# Patient Record
Sex: Male | Born: 1985 | Race: White | Hispanic: No | Marital: Single | Smoking: Current every day smoker
Health system: Southern US, Community
[De-identification: ages and names within clinical notes are randomized; demographics above are authoritative.]

## PROBLEM LIST (undated history)

## (undated) DIAGNOSIS — J45909 Unspecified asthma, uncomplicated: Secondary | ICD-10-CM

## (undated) DIAGNOSIS — J3089 Other allergic rhinitis: Secondary | ICD-10-CM

## (undated) DIAGNOSIS — J449 Chronic obstructive pulmonary disease, unspecified: Secondary | ICD-10-CM

## (undated) HISTORY — PX: ROTATOR CUFF REPAIR: SHX139

---

## 2021-06-06 ENCOUNTER — Encounter (HOSPITAL_BASED_OUTPATIENT_CLINIC_OR_DEPARTMENT_OTHER): Payer: Self-pay

## 2021-06-06 ENCOUNTER — Other Ambulatory Visit: Payer: Self-pay

## 2021-06-06 ENCOUNTER — Emergency Department (HOSPITAL_BASED_OUTPATIENT_CLINIC_OR_DEPARTMENT_OTHER): Payer: Self-pay

## 2021-06-06 ENCOUNTER — Emergency Department (HOSPITAL_BASED_OUTPATIENT_CLINIC_OR_DEPARTMENT_OTHER)
Admission: EM | Admit: 2021-06-06 | Discharge: 2021-06-06 | Disposition: A | Discharge status: Home / Self Care | Payer: Self-pay | Attending: Emergency Medicine | Admitting: Emergency Medicine

## 2021-06-06 DIAGNOSIS — W312XXA Contact with powered woodworking and forming machines, initial encounter: Secondary | ICD-10-CM | POA: Insufficient documentation

## 2021-06-06 DIAGNOSIS — Y93H2 Activity, gardening and landscaping: Secondary | ICD-10-CM | POA: Insufficient documentation

## 2021-06-06 DIAGNOSIS — F17210 Nicotine dependence, cigarettes, uncomplicated: Secondary | ICD-10-CM | POA: Insufficient documentation

## 2021-06-06 DIAGNOSIS — Z23 Encounter for immunization: Secondary | ICD-10-CM | POA: Insufficient documentation

## 2021-06-06 DIAGNOSIS — J449 Chronic obstructive pulmonary disease, unspecified: Secondary | ICD-10-CM | POA: Insufficient documentation

## 2021-06-06 DIAGNOSIS — S81012A Laceration without foreign body, left knee, initial encounter: Secondary | ICD-10-CM | POA: Insufficient documentation

## 2021-06-06 DIAGNOSIS — J45909 Unspecified asthma, uncomplicated: Secondary | ICD-10-CM | POA: Insufficient documentation

## 2021-06-06 HISTORY — DX: Unspecified asthma, uncomplicated: J45.909

## 2021-06-06 HISTORY — DX: Other allergic rhinitis: J30.89

## 2021-06-06 HISTORY — DX: Chronic obstructive pulmonary disease, unspecified: J44.9

## 2021-06-06 MED ORDER — TETANUS-DIPHTH-ACELL PERTUSSIS 5-2.5-18.5 LF-MCG/0.5 IM SUSY
0.5000 mL | PREFILLED_SYRINGE | Freq: Once | INTRAMUSCULAR | Status: AC
  Administered 2021-06-06: 0.5 mL via INTRAMUSCULAR
  Filled 2021-06-06: qty 0.5

## 2021-06-06 NOTE — ED Provider Notes (Signed)
[facility] EMERGENCY DEPARTMENT Provider Note   CSN: [id number] Arrival date & time: 06/06/21  1438     History Chief Complaint  Patient presents with   Extremity Laceration    Jose Sawyer is a 35 y.o. male with no prior past medical history that comes into the emergency department today for laceration.  Patient states that he was cutting his tree in his backyard that was dead, states that the tree " kicked back" at his left knee and he accidentally knicked his left knee with a chainsaw.  Patient states that he was wearing 2 pairs of pants and he has a small laceration to his left knee.  Denies any knee pain besides where he was cut.  Denies any popping to his knee.  Able to ambulate normally after this.  Denies any numbness or tingling or weakness into his knee.  Denies any difficulty ambulating.  Unknown when his last tetanus was.  Denies any injury elsewhere.  No difficulty bending knee.  No other complaints at this time.  HPI     Past Medical History:  Diagnosis Date   Asthma    COPD (chronic obstructive pulmonary disease) (HCC)    Environmental and seasonal allergies     There are no problems to display for this patient.   Past Surgical History:  Procedure Laterality Date   ROTATOR CUFF REPAIR         No family history on file.  Social History   Tobacco Use   Smoking status: Every Day    Pack years: 0.00    Types: Cigarettes  Substance Use Topics   Alcohol use: Yes    Comment: occ   Drug use: Never    Home Medications Prior to Admission medications   Not on File    Allergies    Aspirin, Penicillins, Risperidone and related, and Tegretol [carbamazepine]  Review of Systems   Review of Systems  Constitutional:  Negative for diaphoresis, fatigue and fever.  Eyes:  Negative for visual disturbance.  Respiratory:  Negative for shortness of breath.   Cardiovascular:  Negative for chest pain.  Gastrointestinal:  Negative for nausea and  vomiting.  Musculoskeletal:  Negative for arthralgias, back pain and myalgias.  Skin:  Positive for wound. Negative for color change, pallor and rash.  Neurological:  Negative for syncope, weakness, light-headedness, numbness and headaches.  Psychiatric/Behavioral:  Negative for behavioral problems and confusion.    Physical Exam Updated Vital Signs BP (!) 143/91 (BP Location: Left Arm)   Pulse 88   Temp 98.5 F (36.9 C) (Oral)   Resp 18   Ht 5\' 5"  (1.651 m)   Wt 67.1 kg   SpO2 100%   BMI 24.63 kg/m   Physical Exam Constitutional:      General: He is not in acute distress.    Appearance: Normal appearance. He is not ill-appearing, toxic-appearing or diaphoretic.  Cardiovascular:     Rate and Rhythm: Normal rate and regular rhythm.     Pulses: Normal pulses.  Pulmonary:     Effort: Pulmonary effort is normal.     Breath sounds: Normal breath sounds.  Musculoskeletal:        General: Normal range of motion.     Comments: 5 cm laceration to left knee.  Extremely superficial.  Bleeding has been controlled.  No other lacerations noted.  Normal range of motion to knee with normal laxity tests.  Good range of motion to ankle and hip as well.  DP pulse 2+.  Good sensation throughout.  Normal ambulation.  Skin:    General: Skin is warm and dry.     Capillary Refill: Capillary refill takes less than 2 seconds.  Neurological:     General: No focal deficit present.     Mental Status: He is alert and oriented to person, place, and time.  Psychiatric:        Mood and Affect: Mood normal.        Behavior: Behavior normal.        Thought Content: Thought content normal.    ED Results / Procedures / Treatments   Labs (all labs ordered are listed, but only abnormal results are displayed) Labs Reviewed - No data to display  EKG None  Radiology DG Knee 2 Views Left  Result Date: 06/06/2021 CLINICAL DATA:  Pain with laceration EXAM: LEFT KNEE - 1-2 VIEW COMPARISON:  None. FINDINGS:  Frontal and lateral views obtained. No fracture, dislocation, or joint effusion. Joint spaces appear normal. No erosive change. No radiopaque foreign body. No soft tissue air. IMPRESSION: No fracture, dislocation, or joint effusion. No appreciable arthropathy. No soft tissue air or radiopaque foreign body. Electronically Signed   By: Bretta Bang III M.D.   On: 06/06/2021 15:49    Procedures .Marland KitchenLaceration Repair  Date/Time: 06/06/2021 4:06 PM Performed by: Farrel Gordon, PA-C Authorized by: Farrel Gordon, PA-C   Consent:    Consent obtained:  Verbal   Consent given by:  Patient   Risks discussed:  Infection, need for additional repair, pain, poor cosmetic result and poor wound healing   Alternatives discussed:  No treatment and delayed treatment Universal protocol:    Procedure explained and questions answered to patient or proxy's satisfaction: yes     Relevant documents present and verified: yes     Test results available: yes     Imaging studies available: yes     Required blood products, implants, devices, and special equipment available: yes     Site/side marked: yes     Immediately prior to procedure, a time out was called: yes     Patient identity confirmed:  Verbally with patient Anesthesia:    Anesthesia method:  Local infiltration   Local anesthetic:  Lidocaine 1% w/o epi Laceration details:    Location:  Leg   Leg location:  L knee   Length (cm):  4   Depth (mm):  1 Exploration:    Hemostasis achieved with:  Direct pressure   Imaging obtained: x-ray     Imaging outcome: foreign body not noted     Wound extent: no areolar tissue violation noted and no fascia violation noted     Contaminated: no   Treatment:    Area cleansed with:  Povidone-iodine   Amount of cleaning:  Standard   Irrigation solution:  Sterile saline   Irrigation volume:  500   Irrigation method:  Pressure wash   Visualized foreign bodies/material removed: no     Debridement:  None    Undermining:  None   Scar revision: no   Skin repair:    Repair method:  Sutures   Suture size:  4-0   Suture material:  Prolene   Number of sutures:  5 Approximation:    Approximation:  Close Repair type:    Repair type:  Simple Post-procedure details:    Dressing:  Splint for protection   Procedure completion:  Tolerated well, no immediate complications   Medications Ordered in ED Medications  Tdap (BOOSTRIX) injection 0.5 mL (0.5 mLs Intramuscular Given 06/06/21 1520)    ED Course  I have reviewed the triage vital signs and the nursing notes.  Pertinent labs & imaging results that were available during my care of the patient were reviewed by me and considered in my medical decision making (see chart for details).    MDM Rules/Calculators/A&P                          Patient presents to the emergency department today for knee laceration. No concerns for joint injury.  Laceration is extremely superficial, patient was able to be sutured without any immediate complications.  Patient able to bend knee, distally neurovascularly intact.  Tetanus updated today.  Do not think that antibiotics are appropriate at this time since laceration is superficial and area was irrigated appropriately.Plain films negative.   Wound management discussed with patient, patient to be discharged at this time.  Patient is able to ambulate normally.  Doubt need for further emergent work up at this time. I explained the diagnosis and have given explicit precautions to return to the ER including for any other new or worsening symptoms. The patient understands and accepts the medical plan as it's been dictated and I have answered their questions. Discharge instructions concerning home care and prescriptions have been given. The patient is STABLE and is discharged to home in good condition.  Final Clinical Impression(s) / ED Diagnoses Final diagnoses:  Laceration of left knee, initial encounter    Rx / DC  Orders ED Discharge Orders     None        Farrel Gordon, PA-C 06/06/21 1614    Virgina Norfolk, DO 06/06/21 1623

## 2021-06-06 NOTE — ED Triage Notes (Addendum)
Pt states he cut left knee with chainsaw while cutting trees ~30 min PTA-lac noted-bleeding controlled-NAD-to triage in w/c

## 2021-06-06 NOTE — Discharge Instructions (Addendum)

## 2021-06-06 NOTE — ED Notes (Signed)
Patient transported to X-ray 

## 2021-06-06 NOTE — ED Notes (Signed)
Presents with laceration to left knee, approx 1-2cm in length, bleeding is under control. States was cutting down a tree when the tree "kicked back" at hit his left knee. Unknown on tetanus status.

## 2021-06-06 NOTE — ED Notes (Addendum)
Area cleaned and inspected. Has normal sensation, able to ambulate without difficulty, states when knee is still only c/o of soreness

## 2021-07-12 ENCOUNTER — Encounter (HOSPITAL_BASED_OUTPATIENT_CLINIC_OR_DEPARTMENT_OTHER): Payer: Self-pay | Admitting: *Deleted

## 2021-07-12 ENCOUNTER — Other Ambulatory Visit: Payer: Self-pay

## 2021-07-12 DIAGNOSIS — J40 Bronchitis, not specified as acute or chronic: Secondary | ICD-10-CM | POA: Insufficient documentation

## 2021-07-12 DIAGNOSIS — J449 Chronic obstructive pulmonary disease, unspecified: Secondary | ICD-10-CM | POA: Insufficient documentation

## 2021-07-12 DIAGNOSIS — J45909 Unspecified asthma, uncomplicated: Secondary | ICD-10-CM | POA: Diagnosis not present

## 2021-07-12 DIAGNOSIS — F17210 Nicotine dependence, cigarettes, uncomplicated: Secondary | ICD-10-CM | POA: Insufficient documentation

## 2021-07-12 DIAGNOSIS — Z20822 Contact with and (suspected) exposure to covid-19: Secondary | ICD-10-CM | POA: Diagnosis not present

## 2021-07-12 DIAGNOSIS — J069 Acute upper respiratory infection, unspecified: Secondary | ICD-10-CM | POA: Insufficient documentation

## 2021-07-12 DIAGNOSIS — R059 Cough, unspecified: Secondary | ICD-10-CM | POA: Diagnosis present

## 2021-07-12 NOTE — ED Triage Notes (Signed)
Headache and sob x 4 days.

## 2021-07-13 ENCOUNTER — Emergency Department (HOSPITAL_BASED_OUTPATIENT_CLINIC_OR_DEPARTMENT_OTHER): Payer: No Typology Code available for payment source

## 2021-07-13 ENCOUNTER — Emergency Department (HOSPITAL_BASED_OUTPATIENT_CLINIC_OR_DEPARTMENT_OTHER)
Admission: EM | Admit: 2021-07-13 | Discharge: 2021-07-13 | Disposition: A | Discharge status: Home / Self Care | Payer: No Typology Code available for payment source | Attending: Emergency Medicine | Admitting: Emergency Medicine

## 2021-07-13 DIAGNOSIS — J40 Bronchitis, not specified as acute or chronic: Secondary | ICD-10-CM

## 2021-07-13 DIAGNOSIS — J069 Acute upper respiratory infection, unspecified: Secondary | ICD-10-CM

## 2021-07-13 LAB — RESP PANEL BY RT-PCR (FLU A&B, COVID) ARPGX2
Influenza A by PCR: NEGATIVE
Influenza B by PCR: NEGATIVE
SARS Coronavirus 2 by RT PCR: NEGATIVE

## 2021-07-13 MED ORDER — DOXYCYCLINE HYCLATE 100 MG PO CAPS: 100.0000 mg | ORAL_CAPSULE | Freq: Two times a day (BID) | ORAL | 0 refills | Status: DC

## 2021-07-13 MED ORDER — ALBUTEROL SULFATE HFA 108 (90 BASE) MCG/ACT IN AERS: 1.0000 | INHALATION_SPRAY | Freq: Four times a day (QID) | RESPIRATORY_TRACT | 0 refills | Status: AC | PRN

## 2021-07-13 MED ORDER — ALBUTEROL SULFATE HFA 108 (90 BASE) MCG/ACT IN AERS
2.0000 | INHALATION_SPRAY | Freq: Once | RESPIRATORY_TRACT | Status: AC
  Administered 2021-07-13: 2 via RESPIRATORY_TRACT
  Filled 2021-07-13: qty 6.7

## 2021-07-13 NOTE — Discharge Instructions (Addendum)
Using albuterol every 6 hours as needed and take the antibiotics as prescribed.  Stop smoking.  Your COVID test is negative but COVID is still possible so keep yourself quarantined while you are feeling ill.  Return to the ED with difficulty breathing, chest pain, any other concerns.

## 2021-07-13 NOTE — ED Provider Notes (Signed)
[facility] EMERGENCY DEPARTMENT Provider Note   CSN: [id number] Arrival date & time: 07/12/21  2210     History Chief Complaint  Patient presents with   URI    Jose Sawyer is a 35 y.o. male.  Patient here with concerns for COPD exacerbation.  States he is a smoker and also has a family history of hereditary COPD.  Reports difficulty breathing for the past 3 to 4 days with cough productive of clear mucus.  Using albuterol at home without relief.  Normally uses albuterol on his as-needed basis but has been using it 3-4 times daily for the past 3 days.  Cough productive of clear mucus.  Denies fever, chills, chest pain, abdominal pain, nausea or vomiting.  Also complains of "migraine headache" is gradual in onset for the past 3 days as well.  Denies thunderclap onset. He has never been hospitalized for his breathing. No leg pain or leg swelling.  The history is provided by the patient.  URI Presenting symptoms: congestion and cough   Presenting symptoms: no fever and no rhinorrhea   Associated symptoms: headaches   Associated symptoms: no arthralgias and no myalgias       Past Medical History:  Diagnosis Date   Asthma    COPD (chronic obstructive pulmonary disease) (HCC)    Environmental and seasonal allergies     There are no problems to display for this patient.   Past Surgical History:  Procedure Laterality Date   ROTATOR CUFF REPAIR         No family history on file.  Social History   Tobacco Use   Smoking status: Every Day    Types: Cigarettes   Smokeless tobacco: Never  Vaping Use   Vaping Use: Never used  Substance Use Topics   Alcohol use: Yes    Comment: occ   Drug use: Never    Home Medications Prior to Admission medications   Medication Sig Start Date End Date Taking? Authorizing Provider  albuterol (VENTOLIN HFA) 108 (90 Base) MCG/ACT inhaler Inhale into the lungs. 05/28/20  Yes [provider]    Allergies    Aspirin,  Penicillins, Risperidone and related, and Tegretol [carbamazepine]  Review of Systems   Review of Systems  Constitutional:  Negative for activity change, appetite change and fever.  HENT:  Positive for congestion. Negative for rhinorrhea.   Respiratory:  Positive for cough and shortness of breath.   Cardiovascular:  Negative for chest pain and leg swelling.  Gastrointestinal:  Negative for abdominal pain, nausea and vomiting.  Genitourinary:  Negative for dysuria and hematuria.  Musculoskeletal:  Negative for arthralgias and myalgias.  Skin:  Negative for rash and wound.  Neurological:  Positive for headaches. Negative for dizziness, weakness and light-headedness.   all other systems are negative except as noted in the HPI and PMH.   Physical Exam Updated Vital Signs BP 124/73 (BP Location: Right Arm)   Pulse 82   Temp 99 F (37.2 C) (Oral)   Resp 20   Ht 5\' 5"  (1.651 m)   Wt 67.1 kg   SpO2 98%   BMI 24.62 kg/m   Physical Exam Vitals and nursing note reviewed.  Constitutional:      General: He is not in acute distress.    Appearance: He is well-developed. He is not ill-appearing.     Comments: Speaking full sentences, no distress  HENT:     Head: Normocephalic and atraumatic.     Mouth/Throat:  Pharynx: No oropharyngeal exudate.  Eyes:     Conjunctiva/sclera: Conjunctivae normal.     Pupils: Pupils are equal, round, and reactive to light.  Neck:     Comments: No meningismus. Cardiovascular:     Rate and Rhythm: Normal rate and regular rhythm.     Heart sounds: Normal heart sounds. No murmur heard. Pulmonary:     Effort: Pulmonary effort is normal. No respiratory distress.     Breath sounds: Normal breath sounds. No wheezing or rhonchi.  Abdominal:     Palpations: Abdomen is soft.     Tenderness: There is no abdominal tenderness. There is no guarding or rebound.  Musculoskeletal:        General: No tenderness. Normal range of motion.     Cervical back: Normal  range of motion and neck supple.  Skin:    General: Skin is warm.  Neurological:     Mental Status: He is alert and oriented to person, place, and time.     Cranial Nerves: No cranial nerve deficit.     Motor: No abnormal muscle tone.     Coordination: Coordination normal.     Comments: No ataxia on finger to nose bilaterally. No pronator drift. 5/5 strength throughout. CN 2-12 intact.Equal grip strength. Sensation intact.   Psychiatric:        Behavior: Behavior normal.    ED Results / Procedures / Treatments   Labs (all labs ordered are listed, but only abnormal results are displayed) Labs Reviewed  RESP PANEL BY RT-PCR (FLU A&B, COVID) [id number]    EKG None  Radiology DG Chest Portable 1 View  Result Date: 07/13/2021 CLINICAL DATA:  Shortness of breath EXAM: PORTABLE CHEST 1 VIEW COMPARISON:  Radiograph 05/28/2020 (lateral view only) FINDINGS: No consolidation, features of edema, pneumothorax, or effusion. Pulmonary vascularity is normally distributed. The cardiomediastinal contours are unremarkable. No acute osseous or soft tissue abnormality. Postsurgical changes of the left shoulder, likely reflecting a prior Latarjet procedure with additional features of the left shoulder suggesting prior Hill-Sachs deformity. IMPRESSION: No acute cardiopulmonary abnormality. Electronically Signed   By: Kreg Shropshire M.D.   On: 07/13/2021 02:16    Procedures Procedures   Medications Ordered in ED Medications  albuterol (VENTOLIN HFA) 108 (90 Base) MCG/ACT inhaler 2 puff (has no administration in time range)    ED Course  I have reviewed the triage vital signs and the nursing notes.  Pertinent labs & imaging results that were available during my care of the patient were reviewed by me and considered in my medical decision making (see chart for details).    MDM Rules/Calculators/A&P                          Concern for COPD exacerbation with coughing and shortness of breath over the past  2 days.  No fever.  No hypoxia or increased work of breathing.  No wheezing.  Albuterol given.  Chest x-ray negative for infiltrate.  With his headache as well as congestion and coughing, COVID swab was obtained which is negative.  Patient treated empirically for likely acute on chronic bronchitis.  Encourage smoking cessation.  He is not wheezing so will not use steroids at this time.  Will give antibiotics, rectal dilators and PCP follow-up.  He is requesting referral to pulmonology as well. Low suspicion for ACS, pulmonary embolism.  Resting comfortably on recheck without wheezing or increased work of breathing.  Return to the ED with  difficulty breathing, chest pain or any other concerns.  Alexandr Yaworski Laflam was evaluated in Emergency Department on 07/13/2021 for the symptoms described in the history of present illness. He was evaluated in the context of the global COVID-19 pandemic, which necessitated consideration that the patient might be at risk for infection with the SARS-CoV-2 virus that causes COVID-19. Institutional protocols and algorithms that pertain to the evaluation of patients at risk for COVID-19 are in a state of rapid change based on information released by regulatory bodies including the CDC and federal and state organizations. These policies and algorithms were followed during the patient's care in the ED.  Final Clinical Impression(s) / ED Diagnoses Final diagnoses:  Viral URI with cough  Bronchitis    Rx / DC Orders ED Discharge Orders     None        Zamauri Nez, Jeannett Senior, MD 07/13/21 [phone]

## 2021-08-08 ENCOUNTER — Encounter (HOSPITAL_BASED_OUTPATIENT_CLINIC_OR_DEPARTMENT_OTHER): Payer: Self-pay | Admitting: *Deleted

## 2021-08-08 ENCOUNTER — Institutional Professional Consult (permissible substitution): Payer: Self-pay | Admitting: Student

## 2021-08-08 ENCOUNTER — Other Ambulatory Visit: Payer: Self-pay

## 2021-08-08 ENCOUNTER — Emergency Department (HOSPITAL_BASED_OUTPATIENT_CLINIC_OR_DEPARTMENT_OTHER)
Admission: EM | Admit: 2021-08-08 | Discharge: 2021-08-09 | Disposition: A | Discharge status: Home / Self Care | Payer: No Typology Code available for payment source | Attending: Emergency Medicine | Admitting: Emergency Medicine

## 2021-08-08 DIAGNOSIS — R2241 Localized swelling, mass and lump, right lower limb: Secondary | ICD-10-CM | POA: Diagnosis present

## 2021-08-08 DIAGNOSIS — J45909 Unspecified asthma, uncomplicated: Secondary | ICD-10-CM | POA: Diagnosis not present

## 2021-08-08 DIAGNOSIS — F17210 Nicotine dependence, cigarettes, uncomplicated: Secondary | ICD-10-CM | POA: Insufficient documentation

## 2021-08-08 DIAGNOSIS — L98491 Non-pressure chronic ulcer of skin of other sites limited to breakdown of skin: Secondary | ICD-10-CM | POA: Diagnosis not present

## 2021-08-08 DIAGNOSIS — J449 Chronic obstructive pulmonary disease, unspecified: Secondary | ICD-10-CM | POA: Insufficient documentation

## 2021-08-08 LAB — BASIC METABOLIC PANEL
Anion gap: 8 (ref 5–15)
BUN: 13 mg/dL (ref 6–20)
CO2: 24 mmol/L (ref 22–32)
Calcium: 8.7 mg/dL — ABNORMAL LOW (ref 8.9–10.3)
Chloride: 102 mmol/L (ref 98–111)
Creatinine, Ser: 1.2 mg/dL (ref 0.61–1.24)
GFR, Estimated: >60 mL/min (ref >60)
Glucose, Bld: 107 mg/dL — ABNORMAL HIGH (ref 70–99)
Potassium: 4.7 mmol/L (ref 3.5–5.1)
Sodium: 134 mmol/L — ABNORMAL LOW (ref 135–145)

## 2021-08-08 MED ORDER — DIPHENHYDRAMINE HCL 50 MG/ML IJ SOLN: 25.0000 mg | Freq: Once | INTRAMUSCULAR | Status: DC | PRN

## 2021-08-08 MED ORDER — VANCOMYCIN HCL IN DEXTROSE 1-5 GM/200ML-% IV SOLN
1000.0000 mg | Freq: Once | INTRAVENOUS | Status: AC
  Administered 2021-08-08: 1000 mg via INTRAVENOUS
  Filled 2021-08-08: qty 200

## 2021-08-08 NOTE — ED Provider Notes (Signed)
[facility] Provider Note: Lowella Dell, MD, FACEP  CSN: [id number] MRN: [id number] ARRIVAL: 08/08/21 at 2250 ROOM: MH02/MH02   CHIEF COMPLAINT  Cellulitis   HISTORY OF PRESENT ILLNESS  08/08/21 11:03 PM Jose Sawyer is a 35 y.o. male who was seen at [facility] yesterday for what he describes as a spider bite (he felt something sting his right knee while in the woods 2 days prior to that visit) to his right knee.  There is an ulceration of the right knee with surrounding erythema.  He rates associated pain is a 10 out of 10.  Pain is worse with movement or palpation.  He localizes the pain to the superficial right knee, not deep within the knee joint.  He was diagnosed with cellulitis and placed on clindamycin, 450 mg 3 times daily for 5 days but he lost his paperwork and prescription.  He was not happy with the care he received and came to this facility.  He has not had a fever but did have some bilious vomiting earlier this morning.  He is not nauseated currently.   Past Medical History:  Diagnosis Date   Asthma    COPD (chronic obstructive pulmonary disease) (HCC)    Environmental and seasonal allergies     Past Surgical History:  Procedure Laterality Date   ROTATOR CUFF REPAIR      No family history on file.  Social History   Tobacco Use   Smoking status: Every Day    Types: Cigarettes   Smokeless tobacco: Never  Vaping Use   Vaping Use: Never used  Substance Use Topics   Alcohol use: Yes    Comment: occ   Drug use: Never    Prior to Admission medications   Medication Sig Start Date End Date Taking? Authorizing Provider  clindamycin (CLEOCIN) 150 MG capsule Take 3 capsules (450 mg total) by mouth 3 (three) times daily for 7 days. 08/09/21 08/16/21 Yes [place] Throckmorton, MD  albuterol (VENTOLIN HFA) 108 (90 Base) MCG/ACT inhaler Inhale 1-2 puffs into the lungs every 6 (six) hours as needed for wheezing or shortness of breath. 07/13/21   Rancour,  Jeannett Senior, MD    Allergies Aspirin, Penicillins, Risperidone and related, and Tegretol [carbamazepine]   REVIEW OF SYSTEMS  Negative except as noted here or in the History of Present Illness.   PHYSICAL EXAMINATION  Initial Vital Signs Blood pressure (!) 145/88, pulse 95, temperature 98.3 F (36.8 C), temperature source Oral, resp. rate 18, height 5\' 5"  (1.651 m), weight 66.5 kg, SpO2 100 %.  Examination General: Well-developed, well-nourished male in no acute distress; appearance consistent with age of record HENT: normocephalic; atraumatic Eyes: Normal appearance Neck: supple Heart: regular rate and rhythm Lungs: clear to auscultation bilaterally Abdomen: soft; nondistended; nontender; bowel sounds present Extremities: No deformity; normal range of motion Neurologic: Awake, alert and oriented; motor function intact in all extremities and symmetric; no facial droop Skin: Warm and dry; tender ulceration with fibrinous base and surrounding erythema right knee, wound swabbed for culture:    Psychiatric: Normal mood and affect   RESULTS  Summary of this visit's results, reviewed and interpreted by myself:   EKG Interpretation  Date/Time:    Ventricular Rate:    PR Interval:    QRS Duration:   QT Interval:    QTC Calculation:   R Axis:     Text Interpretation:         Laboratory Studies: Results for orders placed or  performed during the hospital encounter of 08/08/21 (from the past 24 hour(s))  CBC with Differential/Platelet     Status: Abnormal   Collection Time: 08/08/21 11:31 PM  Result Value Ref Range   WBC 13.7 (H) 4.0 - 10.5 K/uL   RBC 4.32 4.22 - 5.81 MIL/uL   Hemoglobin 13.5 13.0 - 17.0 g/dL   HCT 01.7 79.3 - 90.3 %   MCV 90.7 80.0 - 100.0 fL   MCH 31.3 26.0 - 34.0 pg   MCHC 34.4 30.0 - 36.0 g/dL   RDW 00.9 23.3 - 00.7 %   Platelets 254 150 - 400 K/uL   nRBC 0.0 0.0 - 0.2 %   Neutrophils Relative % 74 %   Neutro Abs 10.1 (H) 1.7 - 7.7 K/uL    Lymphocytes Relative 13 %   Lymphs Abs 1.7 0.7 - 4.0 K/uL   Monocytes Relative 9 %   Monocytes Absolute 1.2 (H) 0.1 - 1.0 K/uL   Eosinophils Relative 3 %   Eosinophils Absolute 0.5 0.0 - 0.5 K/uL   Basophils Relative 1 %   Basophils Absolute 0.1 0.0 - 0.1 K/uL   Immature Granulocytes 0 %   Abs Immature Granulocytes 0.05 0.00 - 0.07 K/uL  Basic metabolic panel     Status: Abnormal   Collection Time: 08/08/21 11:31 PM  Result Value Ref Range   Sodium 134 (L) 135 - 145 mmol/L   Potassium 4.7 3.5 - 5.1 mmol/L   Chloride 102 98 - 111 mmol/L   CO2 24 22 - 32 mmol/L   Glucose, Bld 107 (H) 70 - 99 mg/dL   BUN 13 6 - 20 mg/dL   Creatinine, Ser 6.22 0.61 - 1.24 mg/dL   Calcium 8.7 (L) 8.9 - 10.3 mg/dL   GFR, Estimated >63 >33 mL/min   Anion gap 8 5 - 15   Imaging Studies: No results found.  ED COURSE and MDM  Nursing notes, initial and subsequent vitals signs, including pulse oximetry, reviewed and interpreted by myself.  Vitals:   08/08/21 2253 08/08/21 2255 08/09/21 0056  BP:  (!) 145/88 138/74  Pulse:  95 78  Resp:  18 18  Temp:  98.3 F (36.8 C) 97.8 F (36.6 C)  TempSrc:  Oral Oral  SpO2:  100% 100%  Weight: 66.5 kg    Height: 5\' 5"  (1.651 m)     Medications  diphenhydrAMINE (BENADRYL) injection 25 mg (has no administration in time range)  vancomycin (VANCOCIN) IVPB 1000 mg/200 mL premix (0 mg Intravenous Stopped 08/09/21 0045)  mupirocin ointment (BACTROBAN) 2 % (1 application Topical Given 08/09/21 0046)   Patient given 1 g of vancomycin IV.  We will refill his clindamycin prescription.  He was advised that clindamycin may cause diarrhea and he may benefit from a probiotic or yogurt.  Wound has been sent for culture.  He was advised that if this is a black wound that if this is a brown recluse spider bite there is no proven prevention for ulceration and they frequently require surgical revision at a later date but most likely this represents MRSA.   PROCEDURES   Procedures   ED DIAGNOSES     ICD-10-CM   1. Skin ulcer, limited to breakdown of skin (HCC)  L98.491          10/09/21, MD 08/09/21 10/09/21

## 2021-08-08 NOTE — ED Triage Notes (Signed)
Spider bite to his right knee per pt.

## 2021-08-09 LAB — CBC WITH DIFFERENTIAL/PLATELET
Abs Immature Granulocytes: 0.05 10*3/uL (ref 0.00–0.07)
Basophils Absolute: 0.1 10*3/uL (ref 0.0–0.1)
Basophils Relative: 1 %
Eosinophils Absolute: 0.5 10*3/uL (ref 0.0–0.5)
Eosinophils Relative: 3 %
HCT: 39.2 % (ref 39.0–52.0)
Hemoglobin: 13.5 g/dL (ref 13.0–17.0)
Immature Granulocytes: 0 %
Lymphocytes Relative: 13 %
Lymphs Abs: 1.7 10*3/uL (ref 0.7–4.0)
MCH: 31.3 pg (ref 26.0–34.0)
MCHC: 34.4 g/dL (ref 30.0–36.0)
MCV: 90.7 fL (ref 80.0–100.0)
Monocytes Absolute: 1.2 10*3/uL — ABNORMAL HIGH (ref 0.1–1.0)
Monocytes Relative: 9 %
Neutro Abs: 10.1 10*3/uL — ABNORMAL HIGH (ref 1.7–7.7)
Neutrophils Relative %: 74 %
Platelets: 254 10*3/uL (ref 150–400)
RBC: 4.32 MIL/uL (ref 4.22–5.81)
RDW: 13 % (ref 11.5–15.5)
WBC: 13.7 10*3/uL — ABNORMAL HIGH (ref 4.0–10.5)
nRBC: 0 % (ref 0.0–0.2)

## 2021-08-09 MED ORDER — MUPIROCIN CALCIUM 2 % EX CREA: TOPICAL_CREAM | Freq: Once | CUTANEOUS | Status: DC

## 2021-08-09 MED ORDER — CLINDAMYCIN HCL 150 MG PO CAPS: 450.0000 mg | ORAL_CAPSULE | Freq: Three times a day (TID) | ORAL | 0 refills | Status: AC

## 2021-08-09 MED ORDER — MUPIROCIN 2 % EX OINT
TOPICAL_OINTMENT | Freq: Once | CUTANEOUS | Status: AC
  Administered 2021-08-09: 1 via TOPICAL
  Filled 2021-08-09: qty 22

## 2021-08-09 NOTE — ED Notes (Signed)
Patient left ED with ABCs intact, alert and oriented x4, respirations even and unlabored. Discharge instructions reviewed and all questions answered.   

## 2021-08-11 LAB — AEROBIC CULTURE W GRAM STAIN (SUPERFICIAL SPECIMEN)

## 2021-08-12 ENCOUNTER — Telehealth: Payer: Self-pay

## 2021-08-12 NOTE — Telephone Encounter (Signed)
Post ED Visit - Positive Culture Follow-up  Culture report reviewed by antimicrobial stewardship pharmacist: [facility] [x]  [place], Pharm.D. []  Francene Finders, Pharm.D., BCPS AQ-ID []  , Pharm.D., BCPS []  Celedonio Miyamoto, Pharm.D., BCPS []  [place], Garvin Fila.D., BCPS, AAHIVP []  , Pharm.D., BCPS, AAHIVP []  Georgina Pillion, PharmD, BCPS []  , PharmD, BCPS []  [place], PharmD, BCPS []  [address], PharmD []  , PharmD, BCPS []  Estella Husk, PharmD  Pharmacy Team []  Lysle Pearl, PharmD []  , PharmD []  Phillips Climes, PharmD []  , Rph []  Agapito Games) , PharmD []  Verlan Friends, PharmD []  , PharmD []  Mervyn Gay, PharmD []  , PharmD []  Vinnie Level, PharmD []  [facility], PharmD []  , PharmD []  Len Childs, PharmD   Positive blood culture Treated with Clindamycin HCL, organism sensitive to the same and no further patient follow-up is required at this time.  08/12/2021, 11:52 AM

## 2021-09-10 ENCOUNTER — Institutional Professional Consult (permissible substitution): Payer: Self-pay | Admitting: Pulmonary Disease

## 2021-11-22 ENCOUNTER — Other Ambulatory Visit: Payer: Self-pay

## 2021-11-22 ENCOUNTER — Encounter (HOSPITAL_BASED_OUTPATIENT_CLINIC_OR_DEPARTMENT_OTHER): Payer: Self-pay | Admitting: *Deleted

## 2021-11-22 ENCOUNTER — Emergency Department (HOSPITAL_BASED_OUTPATIENT_CLINIC_OR_DEPARTMENT_OTHER)
Admission: EM | Admit: 2021-11-22 | Discharge: 2021-11-22 | Disposition: A | Discharge status: Home / Self Care | Payer: Self-pay | Attending: Emergency Medicine | Admitting: Emergency Medicine

## 2021-11-22 ENCOUNTER — Emergency Department (HOSPITAL_BASED_OUTPATIENT_CLINIC_OR_DEPARTMENT_OTHER): Payer: Self-pay

## 2021-11-22 DIAGNOSIS — W311XXA Contact with metalworking machines, initial encounter: Secondary | ICD-10-CM | POA: Insufficient documentation

## 2021-11-22 DIAGNOSIS — S61225A Laceration with foreign body of left ring finger without damage to nail, initial encounter: Secondary | ICD-10-CM | POA: Insufficient documentation

## 2021-11-22 DIAGNOSIS — S61313A Laceration without foreign body of left middle finger with damage to nail, initial encounter: Secondary | ICD-10-CM

## 2021-11-22 DIAGNOSIS — F17210 Nicotine dependence, cigarettes, uncomplicated: Secondary | ICD-10-CM | POA: Insufficient documentation

## 2021-11-22 DIAGNOSIS — J45909 Unspecified asthma, uncomplicated: Secondary | ICD-10-CM | POA: Insufficient documentation

## 2021-11-22 DIAGNOSIS — Z7952 Long term (current) use of systemic steroids: Secondary | ICD-10-CM | POA: Insufficient documentation

## 2021-11-22 DIAGNOSIS — S61323A Laceration with foreign body of left middle finger with damage to nail, initial encounter: Secondary | ICD-10-CM | POA: Insufficient documentation

## 2021-11-22 DIAGNOSIS — J449 Chronic obstructive pulmonary disease, unspecified: Secondary | ICD-10-CM | POA: Insufficient documentation

## 2021-11-22 DIAGNOSIS — S61215A Laceration without foreign body of left ring finger without damage to nail, initial encounter: Secondary | ICD-10-CM

## 2021-11-22 DIAGNOSIS — S61321A Laceration with foreign body of left index finger with damage to nail, initial encounter: Secondary | ICD-10-CM | POA: Insufficient documentation

## 2021-11-22 DIAGNOSIS — S61311A Laceration without foreign body of left index finger with damage to nail, initial encounter: Secondary | ICD-10-CM

## 2021-11-22 MED ORDER — LIDOCAINE HCL (PF) 1 % IJ SOLN
30.0000 mL | Freq: Once | INTRAMUSCULAR | Status: AC
  Administered 2021-11-22: 30 mL
  Filled 2021-11-22: qty 30

## 2021-11-22 MED ORDER — OXYCODONE-ACETAMINOPHEN 5-325 MG PO TABS
1.0000 | ORAL_TABLET | Freq: Once | ORAL | Status: AC
  Administered 2021-11-22: 1 via ORAL
  Filled 2021-11-22: qty 1

## 2021-11-22 NOTE — ED Provider Notes (Signed)
[facility] EMERGENCY DEPARTMENT Provider Note   CSN: [id number] Arrival date & time: 11/22/21  1817     History Chief Complaint  Patient presents with   Extremity Laceration    Jose Sawyer is a 35 y.o. male with history of asthma and COPD.  Presents to ED with chief plaint of laceration to left index, middle, and ring fingers.  Patient reports that he accidentally cut himself while using a chainsaw approximately 3 hours prior.  Patient complains of pain to laceration site.  Patient rates pain 10/10 on the pain scale.  Patient describes pain as sharp.  Pain is worse with touch or movement.  Patient has not tried any modalities to alleviate his symptoms.  Patient states that his last tetanus shot was "a few months prior."  Patient is right-hand dominant.  Patient denies any numbness or weakness to affected digits.  HPI     Past Medical History:  Diagnosis Date   Asthma    COPD (chronic obstructive pulmonary disease) (HCC)    Environmental and seasonal allergies     There are no problems to display for this patient.   Past Surgical History:  Procedure Laterality Date   ROTATOR CUFF REPAIR         No family history on file.  Social History   Tobacco Use   Smoking status: Every Day    Types: Cigarettes   Smokeless tobacco: Never  Vaping Use   Vaping Use: Never used  Substance Use Topics   Alcohol use: Yes    Comment: occ   Drug use: Never    Home Medications Prior to Admission medications   Medication Sig Start Date End Date Taking? Authorizing Provider  albuterol (VENTOLIN HFA) 108 (90 Base) MCG/ACT inhaler Inhale 1-2 puffs into the lungs every 6 (six) hours as needed for wheezing or shortness of breath. 07/13/21   Rancour, Jeannett Senior, MD    Allergies    Aspirin, Penicillins, Risperidone and related, and Tegretol [carbamazepine]  Review of Systems   Review of Systems  Musculoskeletal:  Positive for myalgias.  Skin:  Positive for wound. Negative  for color change, pallor and rash.  Allergic/Immunologic: Negative for immunocompromised state.  Neurological:  Negative for weakness and numbness.  Hematological:  Does not bruise/bleed easily.   Physical Exam Updated Vital Signs BP (!) 152/80 (BP Location: Right Arm)    Pulse 99    Temp 98.4 F (36.9 C) (Oral)    Resp 18    Ht 5\' 5"  (1.651 m)    Wt 68 kg    SpO2 100%    BMI 24.96 kg/m   Physical Exam Vitals and nursing note reviewed.  Constitutional:      General: He is not in acute distress.    Appearance: He is not ill-appearing, toxic-appearing or diaphoretic.  HENT:     Head: Normocephalic.  Eyes:     General: No scleral icterus.       Right eye: No discharge.        Left eye: No discharge.  Cardiovascular:     Rate and Rhythm: Normal rate.     Pulses:          Radial pulses are 2+ on the right side and 2+ on the left side.  Pulmonary:     Effort: Pulmonary effort is normal.  Musculoskeletal:     Right wrist: No swelling, deformity, effusion, lacerations, tenderness, bony tenderness, snuff box tenderness or crepitus. Normal range of motion. Normal pulse.  Left wrist: No swelling, deformity, effusion, lacerations, tenderness, bony tenderness, snuff box tenderness or crepitus. Normal range of motion. Normal pulse.     Right hand: No swelling, lacerations, tenderness or bony tenderness. Normal range of motion. Normal sensation. Normal capillary refill.     Left hand: Laceration, tenderness and bony tenderness present. No swelling or deformity. Decreased range of motion. Normal sensation.     Comments: 2 cm area of macerated skin to left second digit between DIP and PIP.  Damage to nail left second digit.  Superficial laceration above PIP of left third digit.  Damage to nail of left third digit.  1 cm laceration to PIP of left fourth digit.     Skin:    General: Skin is warm and dry.  Neurological:     General: No focal deficit present.     Mental Status: He is alert.   Psychiatric:        Behavior: Behavior is cooperative.      ED Results / Procedures / Treatments   Labs (all labs ordered are listed, but only abnormal results are displayed) Labs Reviewed - No data to display  EKG None  Radiology DG Hand Complete Left  Result Date: 11/22/2021 CLINICAL DATA:  Laceration from chainsaw. EXAM: LEFT HAND - COMPLETE 3+ VIEW COMPARISON:  Left hand x-ray 02/17/2005 FINDINGS: There is no evidence of fracture or dislocation. There is no evidence of arthropathy or other focal bone abnormality. Soft tissues are unremarkable. There is no radiopaque foreign body. IMPRESSION: Negative. Electronically Signed   By: Darliss Cheney M.D.   On: 11/22/2021 19:19    Procedures Procedures   Medications Ordered in ED Medications  oxyCODONE-acetaminophen (PERCOCET/ROXICET) 5-325 MG per tablet 1 tablet (1 tablet Oral Given 11/22/21 1920)  lidocaine (PF) (XYLOCAINE) 1 % injection 30 mL (30 mLs Infiltration Given 11/22/21 1920)    ED Course  I have reviewed the triage vital signs and the nursing notes.  Pertinent labs & imaging results that were available during my care of the patient were reviewed by me and considered in my medical decision making (see chart for details).    MDM Rules/Calculators/A&P                          35 year old male in no acute distress, nontoxic-appearing.  Presents to ED with complaint of lacerations to left second, third, and fourth digits of left hand.  Patient states that he excellently cut himself with a chainsaw approximately 3 hours prior.  Denies any numbness or weakness.  Patient reports last tetanus shot was "a few months prior."  Patient is right-hand dominant.  Patient has sensation to all aspects of all digits of left hand.  Lacerations as documented and photographed above.  Patient has decreased range of motion to left second, third, and fourth digit secondary to complaints of pain.  We will give patient Percocet for pain  management.  Will obtain x-ray imaging.  Plan to perform digital blocks for further evaluation of patient's lacerations and range of motion.  X-ray imaging shows no acute osseous abnormalities or retained radiopaque foreign bodies.  Patient declines any digital blocks or suture procedure.  Discussed that without perform digital blocks wound cannot be completely evaluated and there could be involvement of lacerations to joint spaces.  Also informed patient that such procedure cannot be performed if digital blocks are not performed.  Patient expresses understanding and continues to refuse any digital blocks at this  time.  Will have RN cover wounds and discharge patient at this time.  Patient given information for symptomatic treatment.  Patient to follow-up with his PCP this week for reevaluation of wounds.  Discussed strict return precautions.  Discussed results, findings, treatment and follow up. Patient advised of return precautions. Patient verbalized understanding and agreed with plan.      Final Clinical Impression(s) / ED Diagnoses Final diagnoses:  Laceration of left index finger with damage to nail, foreign body presence unspecified, initial encounter  Laceration of left middle finger with damage to nail, foreign body presence unspecified, initial encounter  Laceration of left ring finger without damage to nail, foreign body presence unspecified, initial encounter    Rx / DC Orders ED Discharge Orders     None        Haskel Schroeder, PA-C 11/22/21 2006    [address] DO 11/22/21 2007

## 2021-11-22 NOTE — ED Triage Notes (Addendum)
Pt amb into triage  with bulking drg to left hand C/o fingers lac by chainsaw  x 3 hrs ago , multiple lacs to 2nd, 3rd,4th, 5th fingers

## 2021-11-22 NOTE — Discharge Instructions (Addendum)
You came to the emergency department today to be evaluated for your injuries after being accidentally cut with a chainsaw.  Your x-ray imaging showed no broken bones or dislocations.  A complete evaluation of your lacerations were unable to be performed due to your pain and refusing digital blocks.  No suture procedures were able to be performed today due to refusing digital blocks.  Please do not submerge the wound under water until the wounds are healed.  You may gently clean the area with soap and water.  Please follow-up this week with your primary care provider for repeat evaluation of your wounds.  Get help right away if: Your pain suddenly increases and is severe. You develop severe swelling around the wound. The wound is on your hand or foot, and you cannot properly move a finger or toe. The wound is on your hand or foot, and you notice that your fingers or toes look pale or bluish. You have a red streak going away from your wound. You develop painful lumps near the wound or on skin anywhere else on your body.

## 2022-05-31 IMAGING — DX DG CHEST 1V PORT
1 series · 1 of 1 positions shown · non-contrast
Comparison: Radiograph 05/28/2020 (lateral view only)

CLINICAL DATA: Shortness of breath

EXAM:
PORTABLE CHEST 1 VIEW

[chest ap]
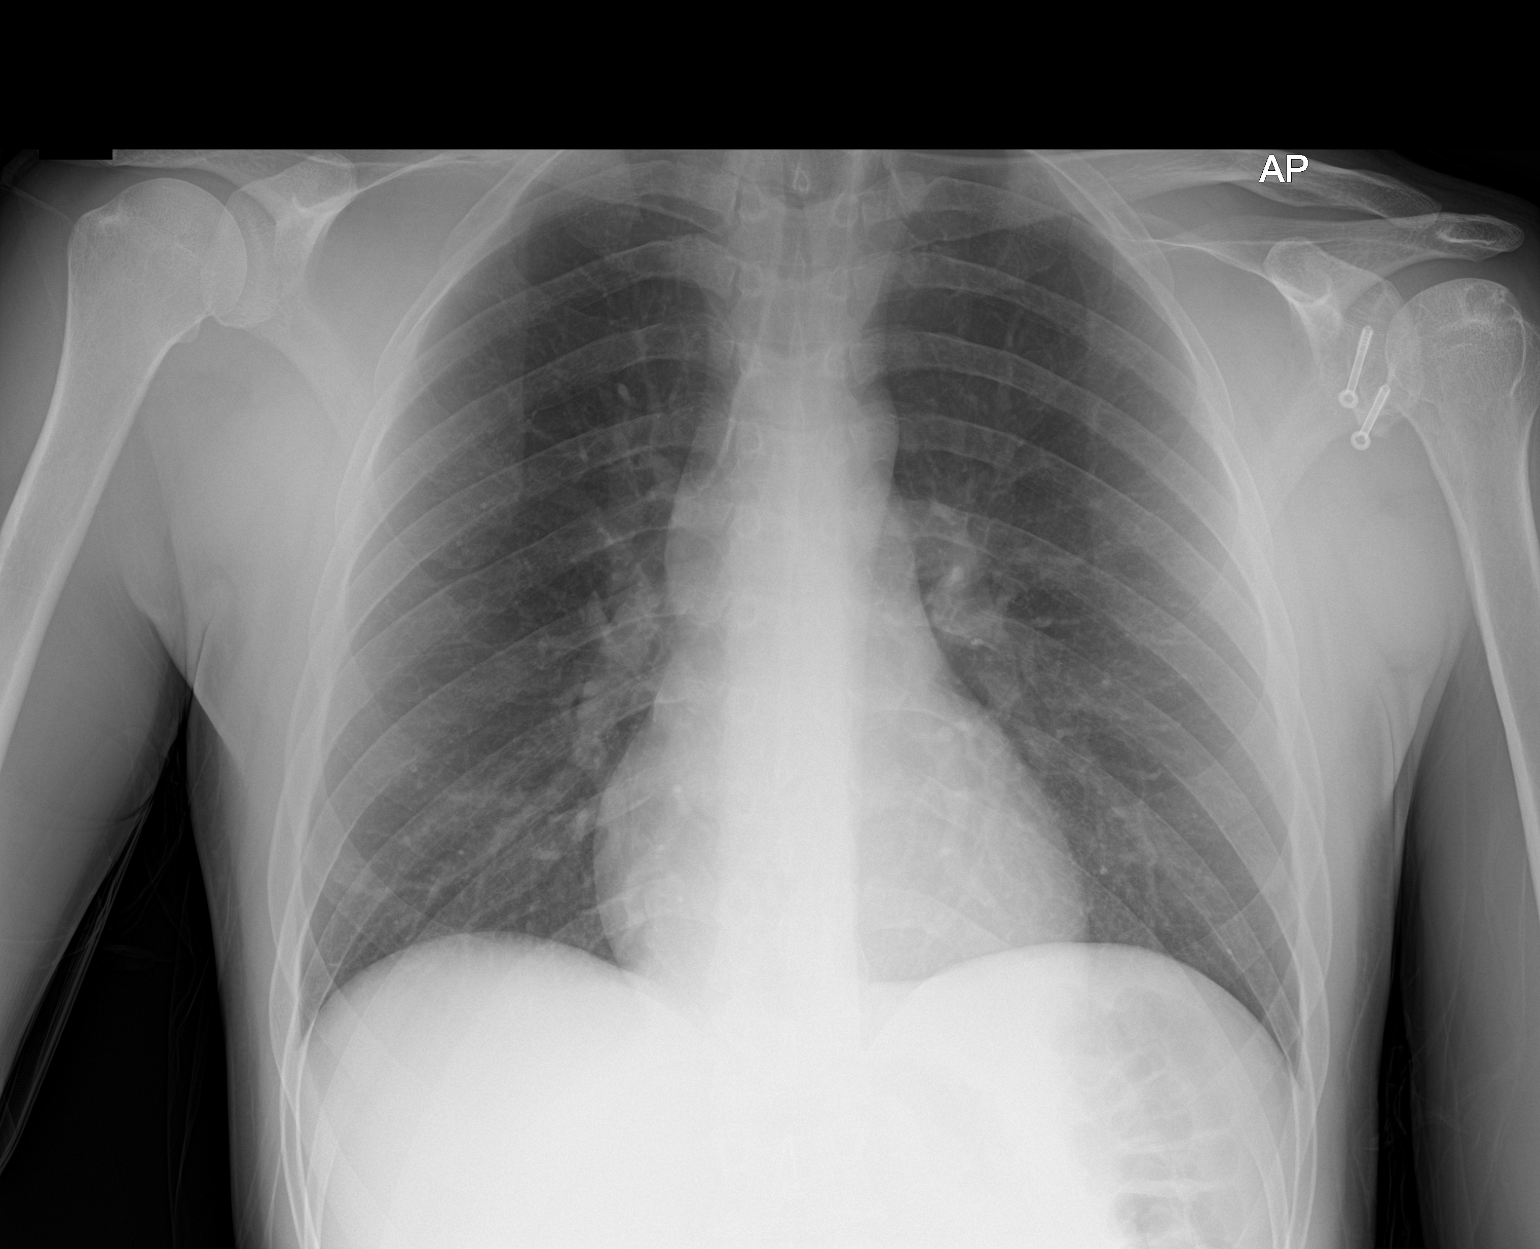

[1 of 1 positions shown; findings below may reference images not displayed]

FINDINGS: No consolidation, features of edema, pneumothorax, or effusion.
Pulmonary vascularity is normally distributed. The cardiomediastinal
contours are unremarkable. No acute osseous or soft tissue
abnormality. Postsurgical changes of the left shoulder, likely
reflecting a prior Latarjet procedure with additional features of
the left shoulder suggesting prior Hill-Sachs deformity.
IMPRESSION: No acute cardiopulmonary abnormality.

## 2022-10-10 IMAGING — DX DG HAND COMPLETE 3+V*L*
3 series · 3 of 3 positions shown · non-contrast
Comparison: Left hand x-ray 02/17/2005

CLINICAL DATA: Laceration from chainsaw.

EXAM:
LEFT HAND - COMPLETE 3+ VIEW

[hand ap]
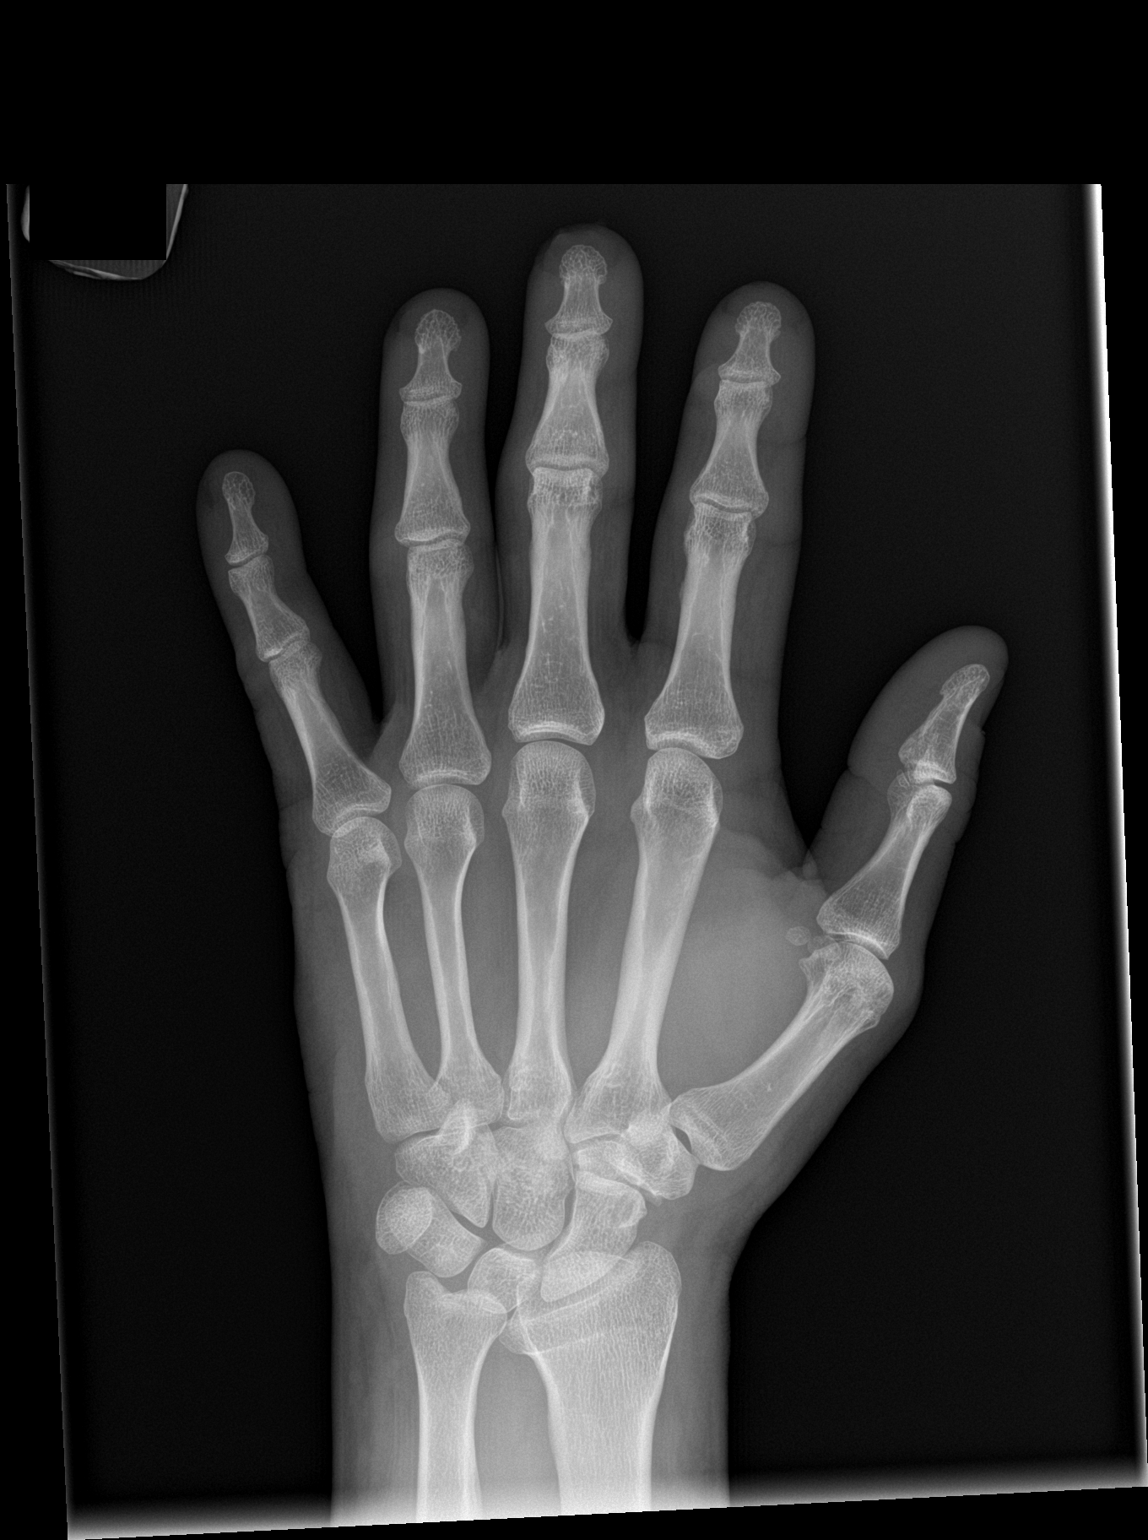

[hand obl]
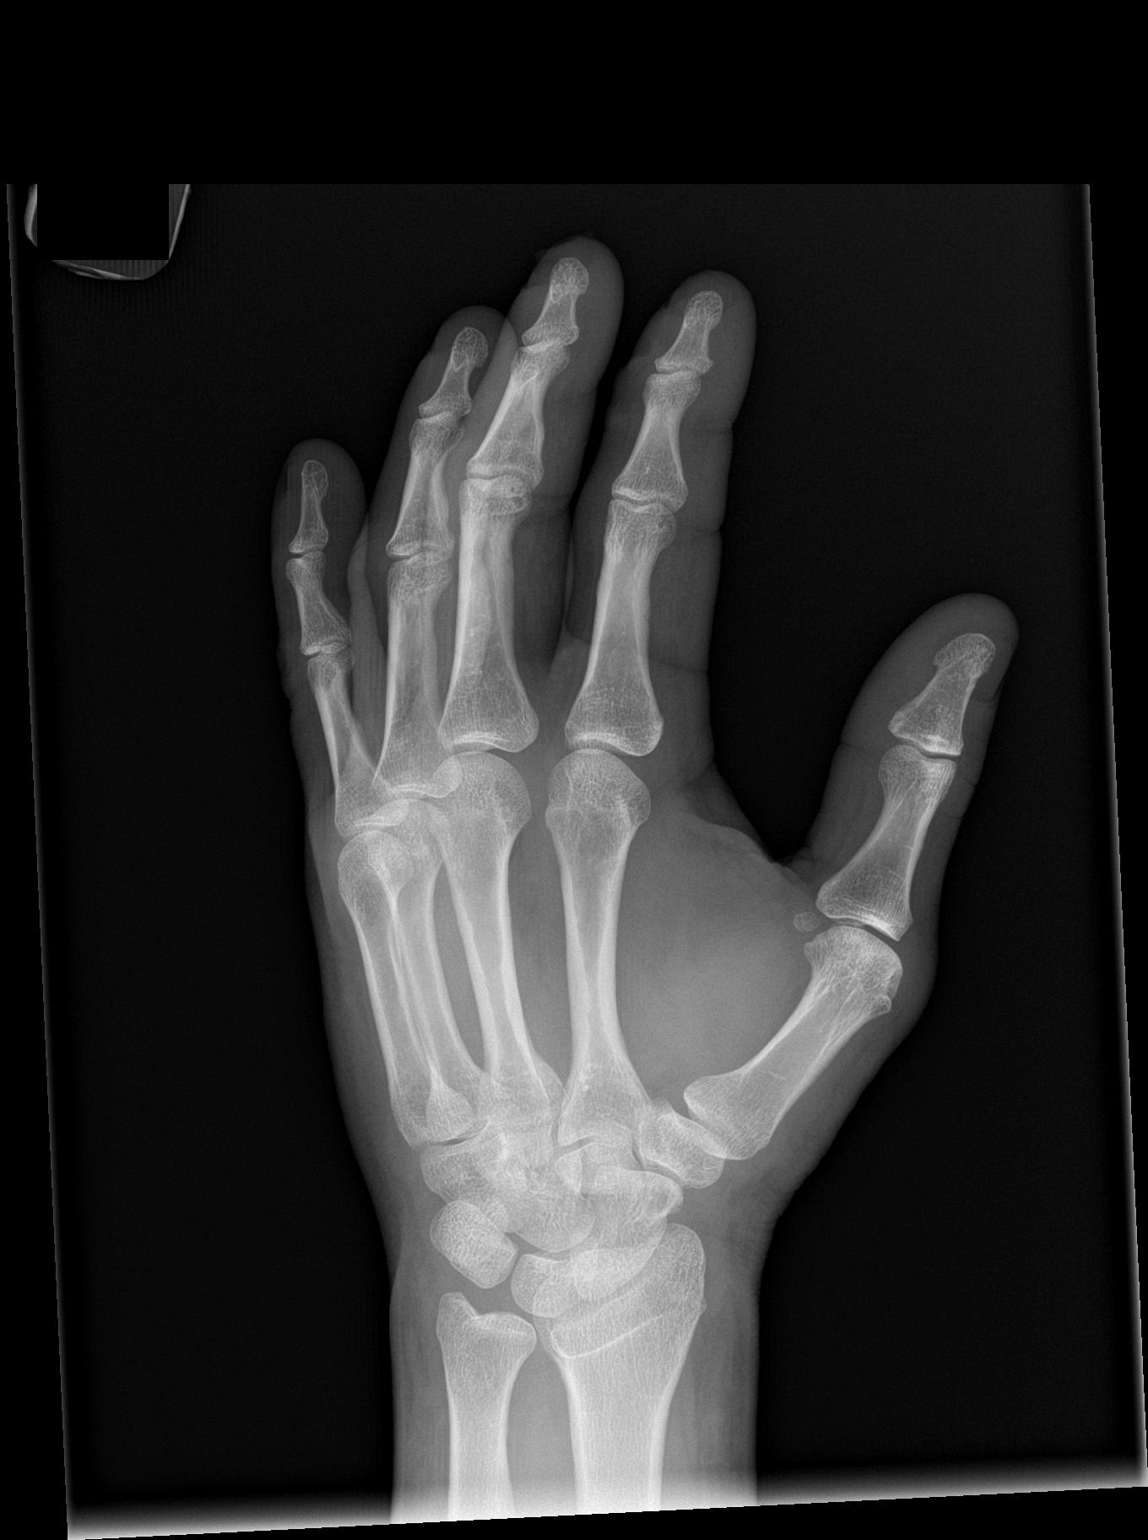

[hand lat]
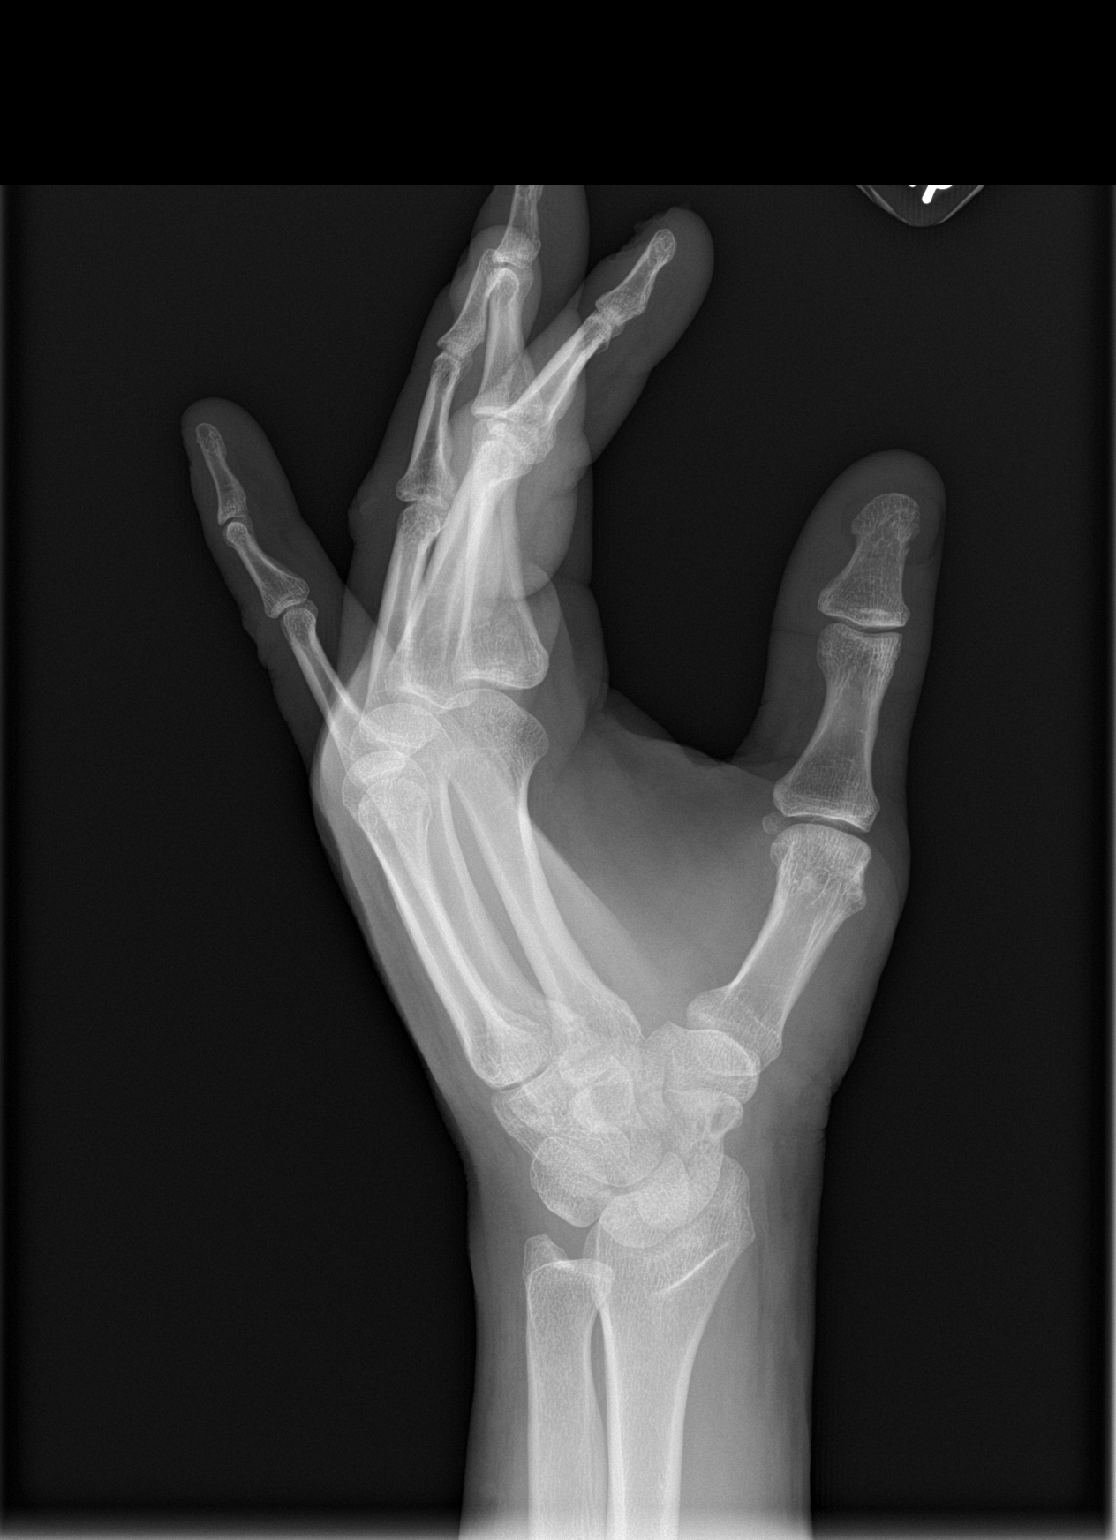

[3 of 3 positions shown; findings below may reference images not displayed]

FINDINGS: There is no evidence of fracture or dislocation. There is no
evidence of arthropathy or other focal bone abnormality. Soft
tissues are unremarkable. There is no radiopaque foreign body.
IMPRESSION: Negative.
